# Patient Record
Sex: Male | Born: 1985 | Race: White | Hispanic: No | Marital: Single | Smoking: Current every day smoker
Health system: Southern US, Community
[De-identification: ages and names within clinical notes are randomized; demographics above are authoritative.]

---

## 2012-05-21 ENCOUNTER — Encounter (HOSPITAL_BASED_OUTPATIENT_CLINIC_OR_DEPARTMENT_OTHER): Payer: Self-pay | Admitting: *Deleted

## 2012-05-21 ENCOUNTER — Emergency Department (HOSPITAL_BASED_OUTPATIENT_CLINIC_OR_DEPARTMENT_OTHER)
Admission: EM | Admit: 2012-05-21 | Discharge: 2012-05-22 | Disposition: A | Discharge status: Home / Self Care | Payer: Self-pay | Attending: Emergency Medicine | Admitting: Emergency Medicine

## 2012-05-21 ENCOUNTER — Emergency Department (HOSPITAL_BASED_OUTPATIENT_CLINIC_OR_DEPARTMENT_OTHER): Payer: Self-pay

## 2012-05-21 DIAGNOSIS — Y990 Civilian activity done for income or pay: Secondary | ICD-10-CM | POA: Insufficient documentation

## 2012-05-21 DIAGNOSIS — S9030XA Contusion of unspecified foot, initial encounter: Secondary | ICD-10-CM | POA: Insufficient documentation

## 2012-05-21 DIAGNOSIS — F17200 Nicotine dependence, unspecified, uncomplicated: Secondary | ICD-10-CM | POA: Insufficient documentation

## 2012-05-21 DIAGNOSIS — Y9289 Other specified places as the place of occurrence of the external cause: Secondary | ICD-10-CM | POA: Insufficient documentation

## 2012-05-21 DIAGNOSIS — Y9389 Activity, other specified: Secondary | ICD-10-CM | POA: Insufficient documentation

## 2012-05-21 DIAGNOSIS — W19XXXA Unspecified fall, initial encounter: Secondary | ICD-10-CM | POA: Insufficient documentation

## 2012-05-21 MED ORDER — TRAMADOL HCL 50 MG PO TABS
50.0000 mg | ORAL_TABLET | Freq: Once | ORAL | Status: AC
  Administered 2012-05-21: 50 mg via ORAL
  Filled 2012-05-21: qty 1

## 2012-05-21 NOTE — ED Notes (Signed)
Pt returned from xr

## 2012-05-21 NOTE — ED Provider Notes (Signed)
History   This chart was scribed for Brian Wolfman Smitty Cords, MD by Brian Drake, ED Scribe. The patient was seen in room MH06/MH06 and the patient's care was started at 11:29PM.       CSN: [id number]  Arrival date & time 05/21/12  2136   None     Chief Complaint  Patient presents with  . Foot Injury    (Consider location/radiation/quality/duration/timing/severity/associated sxs/prior treatment) Patient is a 26 y.o. male presenting with foot injury. The history is provided by the patient. No language interpreter was used.  Foot Injury  The incident occurred more than 1 week ago. The incident occurred at work. The injury mechanism was a fall. The pain is present in the right heel. The quality of the pain is described as aching. The pain is severe. The pain has been constant since onset. Pertinent negatives include no numbness, no inability to bear weight, no loss of motion, no muscle weakness, no loss of sensation and no tingling. He reports no foreign bodies present. The symptoms are aggravated by activity. He has tried nothing for the symptoms. The treatment provided no relief.    Brian Drake is a 26 y.o. male who presents to the Emergency Department complaining of gradual, progressively worsening, foot pain located at the right heel, onset one week ago. The pt reports he injured his right heel at work last week, however, he did not feel as if it was serious enough to seek medical evaluation. The pt walked about 15 miles this afternoon, where he believes he may have aggravated his injury. The pt has not taken any medications to relieve his foot pain at the present point in time. Modifying factors include ambulation and application of pressure upon the right heel which intensifies the foot pain.   The pt denies swelling, bruising, discoloration, and loss of sensation at the right foot.   The pt is a current everyday smoker (0.5 packs/day), however, he does not drink alcohol.     History  reviewed. No pertinent past medical history.  History reviewed. No pertinent past surgical history.  History reviewed. No pertinent family history.  History  Substance Use Topics  . Smoking status: Current Every Day Smoker -- 0.5 packs/day    Types: Cigarettes  . Smokeless tobacco: Not on file  . Alcohol Use: No      Review of Systems  Neurological: Negative for tingling and numbness.  All other systems reviewed and are negative.    Allergies  Review of patient's allergies indicates no known allergies.  Home Medications  No current outpatient prescriptions on file.  BP 136/69  Pulse 71  Temp 98 F (36.7 C) (Oral)  Resp 16  Ht 5\' 11"  (1.803 m)  Wt 157 lb (71.215 kg)  BMI 21.90 kg/m2  SpO2 100%  Physical Exam  Nursing note and vitals reviewed. Constitutional: He is oriented to person, place, and time. He appears well-developed and well-nourished. No distress.  HENT:  Head: Normocephalic and atraumatic.  Nose: Nose normal.  Mouth/Throat: Oropharynx is clear and moist.  Eyes: Conjunctivae normal and EOM are normal. Pupils are equal, round, and reactive to light.  Neck: Normal range of motion. Neck supple.  Cardiovascular: Normal rate, regular rhythm and normal heart sounds.   No murmur heard. Pulmonary/Chest: Effort normal and breath sounds normal. He has no wheezes.  Abdominal: Soft. Bowel sounds are normal. There is no tenderness.  Musculoskeletal: Normal range of motion.  Neurological: He is alert and oriented to person, place, and  time. He has normal reflexes.       Bilateral achilles tendon intact. No deformity noted. Lateral pain on the right plantar heel. Sensation intact. Full motor and sensory intact to the right foot cap refill < 2 sec  Skin: Skin is warm and dry.       Capillary refill less than 2 seconds.   Psychiatric: He has a normal mood and affect. His behavior is normal.    ED Course  Procedures (including critical care time)  DIAGNOSTIC  STUDIES: Oxygen Saturation is 100% on room air, normal by my interpretation.    COORDINATION OF CARE:   11:40 PM- Treatment plan concerning pain management discussed with patient. Pt agrees with treatment.     Labs Reviewed - No data to display  No results found for this or any previous visit. Dg Foot Complete Right  05/22/2012  *RADIOLOGY REPORT*  Clinical Data: Pain at the right heel after jumping off truck.  RIGHT FOOT COMPLETE - 3+ VIEW  Comparison: None.  Findings: There is no evidence of fracture or dislocation.  The joint spaces are preserved.  There is no evidence of talar subluxation; the subtalar joint is unremarkable in appearance.  The calcaneus appears intact.  No significant soft tissue abnormalities are seen.  IMPRESSION: No evidence of fracture or dislocation.   Original Report Authenticated By: Tonia Ghent, M.D.       No diagnosis found.    MDM  Trial pain medication. Follow up with orthopedics for ongoing pain.  Pt verbalizes understanding and agrees to treatment    I personally performed the services described in this documentation, which was scribed in my presence. The recorded information has been reviewed and is accurate.      [name], MD 05/22/12 0430

## 2012-05-21 NOTE — ED Notes (Signed)
Pt c/o right heel injury at work x 1 week ago

## 2012-05-21 NOTE — ED Notes (Signed)
Patient transported to X-ray 

## 2012-05-22 MED ORDER — TRAMADOL HCL 50 MG PO TABS: 50.0000 mg | ORAL_TABLET | Freq: Four times a day (QID) | ORAL | Status: AC | PRN

## 2015-03-03 ENCOUNTER — Other Ambulatory Visit: Payer: Self-pay | Admitting: Surgery

## 2017-01-03 ENCOUNTER — Encounter (HOSPITAL_COMMUNITY): Payer: Self-pay | Admitting: Anesthesiology

## 2017-01-03 ENCOUNTER — Encounter (HOSPITAL_COMMUNITY)
Admission: EM | Disposition: A | Discharge status: Home / Self Care | Payer: Self-pay | Source: Home / Self Care | Attending: Emergency Medicine

## 2017-01-03 ENCOUNTER — Emergency Department (HOSPITAL_COMMUNITY): Payer: Worker's Compensation | Admitting: Anesthesiology

## 2017-01-03 ENCOUNTER — Emergency Department (HOSPITAL_COMMUNITY): Payer: Worker's Compensation

## 2017-01-03 ENCOUNTER — Observation Stay (HOSPITAL_COMMUNITY)
Admission: EM | Admit: 2017-01-03 | Discharge: 2017-01-04 | Disposition: A | Discharge status: Home / Self Care | Payer: Worker's Compensation | Attending: Orthopedic Surgery | Admitting: Orthopedic Surgery

## 2017-01-03 DIAGNOSIS — Z23 Encounter for immunization: Secondary | ICD-10-CM | POA: Insufficient documentation

## 2017-01-03 DIAGNOSIS — F17210 Nicotine dependence, cigarettes, uncomplicated: Secondary | ICD-10-CM | POA: Insufficient documentation

## 2017-01-03 DIAGNOSIS — S52572A Other intraarticular fracture of lower end of left radius, initial encounter for closed fracture: Secondary | ICD-10-CM | POA: Diagnosis present

## 2017-01-03 DIAGNOSIS — S0121XA Laceration without foreign body of nose, initial encounter: Secondary | ICD-10-CM | POA: Insufficient documentation

## 2017-01-03 DIAGNOSIS — Y990 Civilian activity done for income or pay: Secondary | ICD-10-CM | POA: Diagnosis not present

## 2017-01-03 DIAGNOSIS — W11XXXA Fall on and from ladder, initial encounter: Secondary | ICD-10-CM | POA: Insufficient documentation

## 2017-01-03 DIAGNOSIS — S52502A Unspecified fracture of the lower end of left radius, initial encounter for closed fracture: Secondary | ICD-10-CM | POA: Diagnosis present

## 2017-01-03 DIAGNOSIS — W19XXXA Unspecified fall, initial encounter: Secondary | ICD-10-CM

## 2017-01-03 DIAGNOSIS — G5602 Carpal tunnel syndrome, left upper limb: Secondary | ICD-10-CM | POA: Diagnosis not present

## 2017-01-03 DIAGNOSIS — Y9389 Activity, other specified: Secondary | ICD-10-CM | POA: Insufficient documentation

## 2017-01-03 HISTORY — PX: OPEN REDUCTION INTERNAL FIXATION (ORIF) DISTAL RADIAL FRACTURE: SHX5989

## 2017-01-03 HISTORY — PX: CARPAL TUNNEL RELEASE: SHX101

## 2017-01-03 LAB — BASIC METABOLIC PANEL
Anion gap: 8 (ref 5–15)
BUN: 17 mg/dL (ref 6–20)
CHLORIDE: 106 mmol/L (ref 101–111)
CO2: 27 mmol/L (ref 22–32)
CREATININE: 0.69 mg/dL (ref 0.61–1.24)
Calcium: 9.2 mg/dL (ref 8.9–10.3)
GFR calc Af Amer: >60 mL/min (ref >60)
GFR calc non Af Amer: >60 mL/min (ref >60)
GLUCOSE: 93 mg/dL (ref 65–99)
Potassium: 3.7 mmol/L (ref 3.5–5.1)
Sodium: 141 mmol/L (ref 135–145)

## 2017-01-03 LAB — CBC WITH DIFFERENTIAL/PLATELET
Basophils Absolute: 0 10*3/uL (ref 0.0–0.1)
Basophils Relative: 0 %
Eosinophils Absolute: 0.1 10*3/uL (ref 0.0–0.7)
Eosinophils Relative: 1 %
HEMATOCRIT: 40.7 % (ref 39.0–52.0)
HEMOGLOBIN: 14.4 g/dL (ref 13.0–17.0)
LYMPHS ABS: 1.7 10*3/uL (ref 0.7–4.0)
Lymphocytes Relative: 15 %
MCH: 29.7 pg (ref 26.0–34.0)
MCHC: 35.4 g/dL (ref 30.0–36.0)
MCV: 83.9 fL (ref 78.0–100.0)
MONO ABS: 0.6 10*3/uL (ref 0.1–1.0)
Monocytes Relative: 5 %
NEUTROS ABS: 8.8 10*3/uL — AB (ref 1.7–7.7)
NEUTROS PCT: 79 %
Platelets: 157 10*3/uL (ref 150–400)
RBC: 4.85 MIL/uL (ref 4.22–5.81)
RDW: 12.7 % (ref 11.5–15.5)
WBC: 11.2 10*3/uL — ABNORMAL HIGH (ref 4.0–10.5)

## 2017-01-03 SURGERY — OPEN REDUCTION INTERNAL FIXATION (ORIF) DISTAL RADIUS FRACTURE
Anesthesia: General | Site: Wrist | Laterality: Left

## 2017-01-03 MED ORDER — ROCURONIUM BROMIDE 50 MG/5ML IV SOSY
PREFILLED_SYRINGE | INTRAVENOUS | Status: AC
  Filled 2017-01-03: qty 5

## 2017-01-03 MED ORDER — ONDANSETRON HCL 4 MG/2ML IJ SOLN
INTRAMUSCULAR | Status: AC
  Filled 2017-01-03: qty 2

## 2017-01-03 MED ORDER — SODIUM CHLORIDE 0.9 % IV SOLN
Freq: Once | INTRAVENOUS | Status: AC
Start: 2017-01-03 — End: 2017-01-03
  Administered 2017-01-03: 100 mL/h via INTRAVENOUS

## 2017-01-03 MED ORDER — TETANUS-DIPHTH-ACELL PERTUSSIS 5-2.5-18.5 LF-MCG/0.5 IM SUSP
0.5000 mL | Freq: Once | INTRAMUSCULAR | Status: AC
  Administered 2017-01-03: 0.5 mL via INTRAMUSCULAR
  Filled 2017-01-03: qty 0.5

## 2017-01-03 MED ORDER — FENTANYL CITRATE (PF) 100 MCG/2ML IJ SOLN
INTRAMUSCULAR | Status: DC | PRN
  Administered 2017-01-03: 50 ug via INTRAVENOUS
  Administered 2017-01-03 (×2): 25 ug via INTRAVENOUS

## 2017-01-03 MED ORDER — ONDANSETRON HCL 4 MG PO TABS: 4.0000 mg | ORAL_TABLET | Freq: Four times a day (QID) | ORAL | Status: DC | PRN

## 2017-01-03 MED ORDER — SENNA 8.6 MG PO TABS
1.0000 | ORAL_TABLET | Freq: Two times a day (BID) | ORAL | Status: DC
  Administered 2017-01-04: 8.6 mg via ORAL
  Filled 2017-01-03: qty 1

## 2017-01-03 MED ORDER — SUCCINYLCHOLINE CHLORIDE 200 MG/10ML IV SOSY
PREFILLED_SYRINGE | INTRAVENOUS | Status: AC
  Filled 2017-01-03: qty 10

## 2017-01-03 MED ORDER — METHOCARBAMOL 1000 MG/10ML IJ SOLN
500.0000 mg | Freq: Four times a day (QID) | INTRAVENOUS | Status: DC | PRN
  Filled 2017-01-03: qty 5

## 2017-01-03 MED ORDER — ONDANSETRON HCL 4 MG/2ML IJ SOLN: 4.0000 mg | Freq: Four times a day (QID) | INTRAMUSCULAR | Status: DC | PRN

## 2017-01-03 MED ORDER — ALPRAZOLAM 0.5 MG PO TABS: 0.5000 mg | ORAL_TABLET | Freq: Four times a day (QID) | ORAL | Status: DC | PRN

## 2017-01-03 MED ORDER — CEFAZOLIN SODIUM-DEXTROSE 1-4 GM/50ML-% IV SOLN
1.0000 g | INTRAVENOUS | Status: AC
  Administered 2017-01-03: 1 g via INTRAVENOUS
  Filled 2017-01-03: qty 50

## 2017-01-03 MED ORDER — MIDAZOLAM HCL 2 MG/2ML IJ SOLN
2.0000 mg | Freq: Once | INTRAMUSCULAR | Status: AC
  Administered 2017-01-03: 2 mg via INTRAVENOUS

## 2017-01-03 MED ORDER — LIDOCAINE-EPINEPHRINE 2 %-1:100000 IJ SOLN
INTRAMUSCULAR | Status: DC | PRN
  Administered 2017-01-03: 10 mL via PERINEURAL

## 2017-01-03 MED ORDER — ONDANSETRON HCL 4 MG/2ML IJ SOLN
INTRAMUSCULAR | Status: DC | PRN
  Administered 2017-01-03: 4 mg via INTRAVENOUS

## 2017-01-03 MED ORDER — PROPOFOL 10 MG/ML IV BOLUS
INTRAVENOUS | Status: AC
  Filled 2017-01-03: qty 20

## 2017-01-03 MED ORDER — LIDOCAINE 2% (20 MG/ML) 5 ML SYRINGE
INTRAMUSCULAR | Status: DC | PRN
  Administered 2017-01-03: 50 mg via INTRAVENOUS

## 2017-01-03 MED ORDER — DEXAMETHASONE SODIUM PHOSPHATE 10 MG/ML IJ SOLN
INTRAMUSCULAR | Status: AC
  Filled 2017-01-03: qty 1

## 2017-01-03 MED ORDER — SUCCINYLCHOLINE CHLORIDE 200 MG/10ML IV SOSY
PREFILLED_SYRINGE | INTRAVENOUS | Status: DC | PRN
  Administered 2017-01-03: 120 mg via INTRAVENOUS

## 2017-01-03 MED ORDER — SODIUM CHLORIDE 0.45 % IV SOLN
INTRAVENOUS | Status: DC
  Administered 2017-01-04: 02:00:00 via INTRAVENOUS

## 2017-01-03 MED ORDER — DEXAMETHASONE SODIUM PHOSPHATE 10 MG/ML IJ SOLN
INTRAMUSCULAR | Status: DC | PRN
  Administered 2017-01-03: 10 mg via INTRAVENOUS

## 2017-01-03 MED ORDER — PANTOPRAZOLE SODIUM 40 MG PO TBEC: 40.0000 mg | DELAYED_RELEASE_TABLET | Freq: Two times a day (BID) | ORAL | Status: DC | PRN

## 2017-01-03 MED ORDER — SODIUM CHLORIDE 0.9 % IR SOLN
Status: DC | PRN
  Administered 2017-01-03: 1000 mL

## 2017-01-03 MED ORDER — ADULT MULTIVITAMIN W/MINERALS CH
1.0000 | ORAL_TABLET | Freq: Every day | ORAL | Status: DC
  Administered 2017-01-04: 1 via ORAL
  Filled 2017-01-03: qty 1

## 2017-01-03 MED ORDER — LIDOCAINE 2% (20 MG/ML) 5 ML SYRINGE
INTRAMUSCULAR | Status: AC
  Filled 2017-01-03: qty 5

## 2017-01-03 MED ORDER — ROCURONIUM BROMIDE 10 MG/ML (PF) SYRINGE
PREFILLED_SYRINGE | INTRAVENOUS | Status: DC | PRN
  Administered 2017-01-03: 35 mg via INTRAVENOUS
  Administered 2017-01-03: 20 mg via INTRAVENOUS
  Administered 2017-01-03: 5 mg via INTRAVENOUS

## 2017-01-03 MED ORDER — CEFAZOLIN SODIUM-DEXTROSE 1-4 GM/50ML-% IV SOLN
1.0000 g | Freq: Three times a day (TID) | INTRAVENOUS | Status: DC
  Administered 2017-01-04: 1 g via INTRAVENOUS
  Filled 2017-01-03 (×2): qty 50

## 2017-01-03 MED ORDER — VITAMIN C 500 MG PO TABS
1000.0000 mg | ORAL_TABLET | Freq: Every day | ORAL | Status: DC
  Administered 2017-01-04: 1000 mg via ORAL
  Filled 2017-01-03: qty 2

## 2017-01-03 MED ORDER — ROPIVACAINE HCL 5 MG/ML IJ SOLN
INTRAMUSCULAR | Status: DC | PRN
  Administered 2017-01-03: 10 mL via PERINEURAL

## 2017-01-03 MED ORDER — METHOCARBAMOL 500 MG PO TABS: 500.0000 mg | ORAL_TABLET | Freq: Four times a day (QID) | ORAL | Status: DC | PRN

## 2017-01-03 MED ORDER — MIDAZOLAM HCL 5 MG/5ML IJ SOLN
INTRAMUSCULAR | Status: DC | PRN
  Administered 2017-01-03: 2 mg via INTRAVENOUS

## 2017-01-03 MED ORDER — CEFAZOLIN SODIUM-DEXTROSE 2-4 GM/100ML-% IV SOLN
INTRAVENOUS | Status: AC
Start: 2017-01-03 — End: 2017-01-04
  Filled 2017-01-03: qty 100

## 2017-01-03 MED ORDER — FENTANYL CITRATE (PF) 100 MCG/2ML IJ SOLN: 25.0000 ug | INTRAMUSCULAR | Status: DC | PRN

## 2017-01-03 MED ORDER — MORPHINE SULFATE (PF) 4 MG/ML IV SOLN: 1.0000 mg | INTRAVENOUS | Status: DC | PRN

## 2017-01-03 MED ORDER — OXYCODONE-ACETAMINOPHEN 5-325 MG PO TABS
1.0000 | ORAL_TABLET | ORAL | Status: DC | PRN
  Administered 2017-01-03: 1 via ORAL
  Filled 2017-01-03: qty 1

## 2017-01-03 MED ORDER — LACTATED RINGERS IV SOLN: INTRAVENOUS | Status: DC

## 2017-01-03 MED ORDER — PROMETHAZINE HCL 25 MG/ML IJ SOLN: 6.2500 mg | INTRAMUSCULAR | Status: DC | PRN

## 2017-01-03 MED ORDER — LACTATED RINGERS IV SOLN
INTRAVENOUS | Status: DC | PRN
  Administered 2017-01-03: 20:00:00 via INTRAVENOUS

## 2017-01-03 MED ORDER — FENTANYL CITRATE (PF) 100 MCG/2ML IJ SOLN
INTRAMUSCULAR | Status: AC
  Filled 2017-01-03: qty 2

## 2017-01-03 MED ORDER — OXYCODONE HCL 5 MG PO TABS
5.0000 mg | ORAL_TABLET | ORAL | Status: DC | PRN
  Administered 2017-01-04: 11:00:00 10 mg via ORAL
  Filled 2017-01-03: qty 2

## 2017-01-03 MED ORDER — PROPOFOL 10 MG/ML IV BOLUS
INTRAVENOUS | Status: DC | PRN
  Administered 2017-01-03: 150 mg via INTRAVENOUS

## 2017-01-03 MED ORDER — SUGAMMADEX SODIUM 200 MG/2ML IV SOLN
INTRAVENOUS | Status: DC | PRN
  Administered 2017-01-03: 150 mg via INTRAVENOUS

## 2017-01-03 MED ORDER — MIDAZOLAM HCL 2 MG/2ML IJ SOLN
INTRAMUSCULAR | Status: AC
  Filled 2017-01-03: qty 2

## 2017-01-03 MED ORDER — PROMETHAZINE HCL 12.5 MG RE SUPP
12.5000 mg | Freq: Four times a day (QID) | RECTAL | Status: DC | PRN
  Filled 2017-01-03: qty 1

## 2017-01-03 MED ORDER — CEFAZOLIN SODIUM-DEXTROSE 2-3 GM-% IV SOLR
2.0000 g | Freq: Once | INTRAVENOUS | Status: AC
Start: 2017-01-03 — End: 2017-01-03
  Administered 2017-01-03: 2 g via INTRAVENOUS
  Filled 2017-01-03: qty 50

## 2017-01-03 SURGICAL SUPPLY — 75 items
BAG ZIPLOCK 12X15 (MISCELLANEOUS) IMPLANT
BANDAGE ACE 3X5.8 VEL STRL LF (GAUZE/BANDAGES/DRESSINGS) IMPLANT
BANDAGE ACE 4X5 VEL STRL LF (GAUZE/BANDAGES/DRESSINGS) ×6 IMPLANT
BIT DRILL 2.2 SS TIBIAL (BIT) ×6 IMPLANT
BNDG GAUZE ELAST 4 BULKY (GAUZE/BANDAGES/DRESSINGS) ×6 IMPLANT
CORD BIPOLAR FORCEPS 12FT (ELECTRODE) ×3 IMPLANT
COVER SURGICAL LIGHT HANDLE (MISCELLANEOUS) ×3 IMPLANT
CUFF TOURN SGL QUICK 18 (TOURNIQUET CUFF) ×3 IMPLANT
DECANTER SPIKE VIAL GLASS SM (MISCELLANEOUS) IMPLANT
DRAPE OEC MINIVIEW 54X84 (DRAPES) ×3 IMPLANT
DRAPE SURG 17X11 SM STRL (DRAPES) ×3 IMPLANT
DRAPE U-SHAPE 47X51 STRL (DRAPES) ×3 IMPLANT
DRSG EMULSION OIL 3X16 NADH (GAUZE/BANDAGES/DRESSINGS) ×3 IMPLANT
DRSG EMULSION OIL 3X3 NADH (GAUZE/BANDAGES/DRESSINGS) ×3 IMPLANT
DRSG PAD ABDOMINAL 8X10 ST (GAUZE/BANDAGES/DRESSINGS) IMPLANT
ELECT REM PT RETURN 15FT ADLT (MISCELLANEOUS) ×3 IMPLANT
EVACUATOR 1/8 PVC DRAIN (DRAIN) IMPLANT
GAUZE SPONGE 4X4 12PLY STRL (GAUZE/BANDAGES/DRESSINGS) ×3 IMPLANT
GAUZE SPONGE 4X4 16PLY XRAY LF (GAUZE/BANDAGES/DRESSINGS) ×6 IMPLANT
GAUZE XEROFORM 5X9 LF (GAUZE/BANDAGES/DRESSINGS) ×3 IMPLANT
GLOVE BIO SURGEON STRL SZ8 (GLOVE) IMPLANT
GOWN STRL REUS W/TWL XL LVL3 (GOWN DISPOSABLE) ×9 IMPLANT
KIT BASIN OR (CUSTOM PROCEDURE TRAY) ×3 IMPLANT
KWIRE 4.0 X .045IN (WIRE) IMPLANT
KWIRE 4.0 X .062IN (WIRE) IMPLANT
MANIFOLD NEPTUNE II (INSTRUMENTS) ×3 IMPLANT
NEEDLE HYPO 25X1 1.5 SAFETY (NEEDLE) IMPLANT
NS IRRIG 1000ML POUR BTL (IV SOLUTION) ×6 IMPLANT
PACK ORTHO EXTREMITY (CUSTOM PROCEDURE TRAY) ×3 IMPLANT
PAD CAST 3X4 CTTN HI CHSV (CAST SUPPLIES) IMPLANT
PAD CAST 4YDX4 CTTN HI CHSV (CAST SUPPLIES) ×2 IMPLANT
PADDING CAST COTTON 3X4 STRL (CAST SUPPLIES)
PADDING CAST COTTON 4X4 STRL (CAST SUPPLIES) ×4
PEG LOCKING SMOOTH 2.2X18 (Peg) ×9 IMPLANT
PEG LOCKING SMOOTH 2.2X20 (Screw) ×6 IMPLANT
PLATE STD DVR LEFT (Plate) ×3 IMPLANT
PLATE STD DVR LT 24X55 (Plate) ×1 IMPLANT
POSITIONER SURGICAL ARM (MISCELLANEOUS) IMPLANT
SCREW LOCK 10X2.7X3 LD THRD (Screw) ×1 IMPLANT
SCREW LOCK 14X2.7X 3 LD TPR (Screw) ×1 IMPLANT
SCREW LOCK 16X2.7X 3 LD TPR (Screw) ×3 IMPLANT
SCREW LOCK 20X2.7X 3 LD TPR (Screw) ×1 IMPLANT
SCREW LOCKING 2.7X10MM (Screw) ×2 IMPLANT
SCREW LOCKING 2.7X14 (Screw) ×2 IMPLANT
SCREW LOCKING 2.7X16 (Screw) ×6 IMPLANT
SCREW LOCKING 2.7X20MM (Screw) ×2 IMPLANT
SCREW MULTI DIRECTIONAL 2.7X20 (Screw) ×3 IMPLANT
SCREW MULTI DIRECTIONAL 2.7X22 (Screw) ×3 IMPLANT
SOL PREP POV-IOD 4OZ 10% (MISCELLANEOUS) ×3 IMPLANT
SOL PREP PROV IODINE SCRUB 4OZ (MISCELLANEOUS) ×3 IMPLANT
SPLINT FIBERGLASS 4X30 (CAST SUPPLIES) ×3 IMPLANT
SUCTION FRAZIER HANDLE 12FR (TUBING) ×2
SUCTION TUBE FRAZIER 12FR DISP (TUBING) ×1 IMPLANT
SUT BONE WAX W31G (SUTURE) IMPLANT
SUT ETHILON 6 0 PS 3 18 (SUTURE) IMPLANT
SUT MERSILENE 4 0 P 3 (SUTURE) IMPLANT
SUT MNCRL AB 4-0 PS2 18 (SUTURE) IMPLANT
SUT PROLENE 3 0 PS 2 (SUTURE) IMPLANT
SUT PROLENE 4 0 P 3 18 (SUTURE) IMPLANT
SUT PROLENE 4 0 PS 2 18 (SUTURE) ×9 IMPLANT
SUT PROLENE 4 0 RB 1 (SUTURE)
SUT PROLENE 4-0 RB1 .5 CRCL 36 (SUTURE) IMPLANT
SUT VIC AB 0 CT1 27 (SUTURE)
SUT VIC AB 0 CT1 27XBRD ANTBC (SUTURE) IMPLANT
SUT VIC AB 2-0 CT1 27 (SUTURE)
SUT VIC AB 2-0 CT1 TAPERPNT 27 (SUTURE) IMPLANT
SUT VIC AB 3-0 PS2 18 (SUTURE) ×4
SUT VIC AB 3-0 PS2 18XBRD (SUTURE) ×2 IMPLANT
SYR CONTROL 10ML LL (SYRINGE) IMPLANT
SYSTEM CHEST DRAIN TLS 7FR (DRAIN) ×3 IMPLANT
TOWEL OR 17X26 10 PK STRL BLUE (TOWEL DISPOSABLE) ×6 IMPLANT
TUBING CONNECTING 10 (TUBING) ×2 IMPLANT
TUBING CONNECTING 10' (TUBING) ×1
UNDERPAD 30X30 (UNDERPADS AND DIAPERS) ×3 IMPLANT
WATER STERILE IRR 1500ML POUR (IV SOLUTION) IMPLANT

## 2017-01-03 NOTE — Anesthesia Preprocedure Evaluation (Signed)
Anesthesia Evaluation  Patient identified by MRN, date of birth, ID band Patient awake    Reviewed: Allergy & Precautions, NPO status , Patient's Chart, lab work & pertinent test results  Airway Mallampati: II  TM Distance: >3 FB Neck ROM: Full    Dental  (+) Teeth Intact, Dental Advisory Given   Pulmonary Current Smoker,    Pulmonary exam normal breath sounds clear to auscultation       Cardiovascular negative cardio ROS Normal cardiovascular exam Rhythm:Regular Rate:Normal     Neuro/Psych negative neurological ROS     GI/Hepatic negative GI ROS, Neg liver ROS,   Endo/Other  negative endocrine ROS  Renal/GU negative Renal ROS     Musculoskeletal Left distal radius fracture   Abdominal   Peds  Hematology negative hematology ROS (+)   Anesthesia Other Findings Day of surgery medications reviewed with the patient.  Reproductive/Obstetrics                             Anesthesia Physical Anesthesia Plan  ASA: II and emergent  Anesthesia Plan: General   Post-op Pain Management:  Regional for Post-op pain   Induction: Intravenous  PONV Risk Score and Plan: 1 and Ondansetron and Dexamethasone  Airway Management Planned: Oral ETT  Additional Equipment:   Intra-op Plan:   Post-operative Plan: Extubation in OR  Informed Consent: I have reviewed the patients History and Physical, chart, labs and discussed the procedure including the risks, benefits and alternatives for the proposed anesthesia with the patient or authorized representative who has indicated his/her understanding and acceptance.   Dental advisory given  Plan Discussed with: CRNA  Anesthesia Plan Comments: (Risks/benefits of general anesthesia discussed with patient including risk of damage to teeth, lips, gum, and tongue, nausea/vomiting, allergic reactions to medications, and the possibility of heart attack, stroke and  death.  All patient questions answered.  Patient wishes to proceed.)        Anesthesia Quick Evaluation

## 2017-01-03 NOTE — ED Provider Notes (Signed)
[facility]-EMERGENCY DEPT Provider Note   CSN: [id number] Arrival date & time: 01/03/17  1191  By signing my name below, I, Brian Drake, attest that this documentation has been prepared under the direction and in the presence of Lyndel Safe PA-C.  Electronically Signed: Vista Drake, ED Scribe. 01/03/17. 12:33 PM.  History   Chief Complaint Chief Complaint  Patient presents with  . Wrist Injury    HPI HPI Comments: Brian Drake is a 31 y.o. male who presents to the Emergency Department s/p an injury that occurred just prior to arrival. Pt was at the top of a 6 foot ladder when the brace of the ladder gave out. Pt fell off of the ladder landing first with his left wrist outstretched. He also struck his face and nose secondary to the wrist. He has a small laceration and superficial abrasion to his nose. He also notes mild pain to the lateral left leg which he struck during the fall as well, no open wound or active bleeding. Currently, pt has noted swelling and deformity to left wrist. He reports an exacerbation of pain when attempting to move the wrist. Pt denies any numbness or tingling in the hands or fingers. He is able to move the fingers but with decreased ROM due to pain. He was able to ambulate immediately after the incident. No LOC. No pain to left elbow or shoulder. He denies any neck or back pain. He does not have a current PCP. His Tetanus is not UTD.  The history is provided by the patient. No language interpreter was used.    History reviewed. No pertinent past medical history.  Patient Active Problem List   Diagnosis Date Noted  . Closed fracture of left distal radius 01/03/2017    History reviewed. No pertinent surgical history.     Home Medications    Prior to Admission medications   Medication Sig Start Date End Date Taking? Authorizing Provider  naproxen sodium (ANAPROX) 220 MG tablet Take 220 mg by mouth 2 (two) times daily as needed (pain).   Yes [provider]  traMADol (ULTRAM) 50 MG tablet Take 1 tablet (50 mg total) by mouth every 6 (six) hours as needed for pain. Patient not taking: Reported on 01/03/2017 05/22/12   Cy Blamer, MD    Family History History reviewed. No pertinent family history.  Social History Social History  Substance Use Topics  . Smoking status: Current Every Day Smoker    Packs/day: 0.50    Types: Cigarettes  . Smokeless tobacco: Not on file  . Alcohol use No     Allergies   Patient has no known allergies.   Review of Systems Review of Systems  Musculoskeletal: Positive for arthralgias and joint swelling. Negative for back pain, gait problem, myalgias, neck pain and neck stiffness.  Skin: Positive for wound.  Neurological: Negative for weakness and numbness.  All other systems reviewed and are negative.    Physical Exam Updated Vital Signs BP 129/75 (BP Location: Right Arm)   Pulse (!) 59   Temp 98.3 F (36.8 C)   Resp 17   Ht 5\' 11"  (1.803 m)   Wt 70.3 kg (155 lb)   SpO2 97%   BMI 21.62 kg/m   Physical Exam  Constitutional: He is oriented to person, place, and time. He appears well-developed and well-nourished. No distress.  HENT:  Head: Normocephalic and atraumatic.  No septal hematomas or deviation. Small amount of blood around the nares but not in the  nasal canal. Mild hematoma to the left upper lip without obvious cuts, mild swelling.   Eyes: Pupils are equal, round, and reactive to light. EOM are normal. No scleral icterus.  Neck: Normal range of motion.  Pulmonary/Chest: Effort normal.  Musculoskeletal:  No midline or paraspinal tenderness, C, T or L spine.  Obvious deformity to left wrist. Hand is neurovascularly intact. Patient is able to perform active range of motion of all left fingers.    Neurological: He is alert and oriented to person, place, and time.  Patient is grossly neurologic intact. Gait is normal and steady.  Skin: Skin is warm and dry. He is not  diaphoretic.  1cm laceration on the right side of the nose, abrasions over the tip.   Psychiatric: He has a normal mood and affect. Judgment normal.  Nursing note and vitals reviewed.       ED Treatments / Results  DIAGNOSTIC STUDIES: Oxygen Saturation is 100% on RA, normal by my interpretation.  COORDINATION OF CARE: 12:33 PM-Discussed treatment plan with pt at bedside and pt agreed to plan.   Labs (all labs ordered are listed, but only abnormal results are displayed) Labs Reviewed  CBC WITH DIFFERENTIAL/PLATELET - Abnormal; Notable for the following:       Result Value   WBC 11.2 (*)    Neutro Abs 8.8 (*)    All other components within normal limits  BASIC METABOLIC PANEL    EKG  EKG Interpretation None       Radiology Dg Chest 2 View  Result Date: 01/03/2017 CLINICAL DATA:  Preop.  Wrist fracture.  Current everyday smoker. EXAM: CHEST  2 VIEW COMPARISON:  None. FINDINGS: Generous lung volumes. There is no edema, consolidation, effusion, or pneumothorax. Normal heart size and mediastinal contours. IMPRESSION: 1. No evidence of active disease. 2. Large lung volumes. Electronically Signed   By: Marnee SpringJonathon  Watts M.D.   On: 01/03/2017 14:30   Dg Wrist Complete Left  Result Date: 01/03/2017 CLINICAL DATA:  Fall from a ladder this morning.  Initial encounter. EXAM: LEFT WRIST - COMPLETE 3+ VIEW COMPARISON:  None. FINDINGS: Comminuted fracture of the distal radius with intra-articular involvement. Dorsal impaction with dorsal wrist tilting. Probable loss of radial inclination. Neutral ulnar variance. Located radiocarpal joint. No carpus fracture noted. IMPRESSION: Comminuted intra-articular distal radius fracture with dorsal impaction and wrist tilting. Electronically Signed   By: Marnee SpringJonathon  Watts M.D.   On: 01/03/2017 10:36   Dg Hand Complete Left  Result Date: 01/03/2017 CLINICAL DATA:  Fall from ladder with left wrist pain. Initial encounter. EXAM: LEFT HAND - COMPLETE 3+ VIEW  COMPARISON:  None. FINDINGS: Comminuted fracture of the distal radius with and dorsal impaction causing dorsal wrist tilting. Up to 2 mm of articular surface offset at the fracture. No visible carpus fracture or malalignment. IMPRESSION: Comminuted intra-articular distal radius fracture with dorsal impaction. Electronically Signed   By: Marnee SpringJonathon  Watts M.D.   On: 01/03/2017 10:37    Procedures Procedures (including critical care time)  Medications Ordered in ED Medications  Tdap (BOOSTRIX) injection 0.5 mL (0.5 mLs Intramuscular Given 01/03/17 1336)  0.9 %  sodium chloride infusion (100 mL/hr Intravenous New Bag/Given 01/03/17 1442)  Oxycodone acetaminophen 5 mg.    Initial Impression / Assessment and Plan / ED Course  I have reviewed the triage vital signs and the nursing notes.  Pertinent labs & imaging results that were available during my care of the patient were reviewed by me and considered in my  medical decision making (see chart for details).  Clinical Course as of Jan 04 146  Tue Jan 03, 2017  1245 Spoke with Sue Lush from hand in person.  He will see patient in the ED.   [EH]  1338 Spoke with hand PA.  Per his request will start an IV for pain control as needed, obtain CBC, BMP, CXR for pre-surgical clearance and make patient NPO.   [EH]    Clinical Course User Index [EH] Cristina Gong, PA-C   Leighton Roach presents for evaluation of left wrist pain and deformity after falling approximately 6 feet off a ladder. He has minor scrapes and abrasions to his nose, no obvious other significant injuries. He did not strike his head on he fell and is on no blood thinners.  Imaging was obtained of left wrist which showed fracture consistent with deformity seen on physical exam. Hand surgery was consulted who came and evaluated the patient.  Per their requests CBC, BMP, and chest x-ray were obtained for presurgical screenings and the patient was made nothing by mouth.  While  waiting to go to the operating room patient was held in the ED. He was reevaluated multiple times and continued to decline additional pain medication after his wrist was splinted.  Patient was given the option to ask questions, all of which were answered to the best of my abilities. Patient was taken to the operating room for surgical repair.  The patient appears reasonably stabilized for admission considering the current resources, flow, and capabilities available in the ED at this time, and I doubt any other [facility] requiring further screening and/or treatment in the ED prior to admission.    Final Clinical Impressions(s) / ED Diagnoses   Final diagnoses:  Fall, initial encounter  Other closed intra-articular fracture of distal end of left radius, initial encounter    New Prescriptions Current Discharge Medication List    I personally performed the services described in this documentation, which was scribed in my presence. The recorded information has been reviewed and is accurate.     Cristina Gong, PA-C 01/04/17 Geronimo Boot    Rolland Porter, MD 01/05/17 1046

## 2017-01-03 NOTE — ED Notes (Signed)
Bed: [id number] Expected date:  Expected time:  Means of arrival:  Comments: TR 5

## 2017-01-03 NOTE — ED Notes (Signed)
Patient transported to X-ray 

## 2017-01-03 NOTE — Anesthesia Procedure Notes (Signed)
Anesthesia Regional Block: Supraclavicular block   Pre-Anesthetic Checklist: ,, timeout performed, Correct Patient, Correct Site, Correct Laterality, Correct Procedure, Correct Position, site marked, Risks and benefits discussed,  Surgical consent,  Pre-op evaluation,  At surgeon's request and post-op pain management  Laterality: Left  Prep: chloraprep       Needles:  Injection technique: Single-shot  Needle Type: Echogenic Needle     Needle Length: 9cm  Needle Gauge: 21     Additional Needles:   Procedures: ultrasound guided,,,,,,,,  Narrative:  Start time: 01/03/2017 7:21 PM End time: 01/03/2017 7:26 PM Injection made incrementally with aspirations every 5 mL.  Performed by: Personally  Anesthesiologist: Cecile HearingURK, Myrissa Chipley EDWARD  Additional Notes: No pain on injection. No increased resistance to injection. Injection made in 5cc increments.  Good needle visualization.  Patient tolerated procedure well.

## 2017-01-03 NOTE — ED Triage Notes (Signed)
Pt c/o left wrist pain and deformity after falling from 6 foot high ladder onto concrete and landing first and mostly on wrist, also struck nose and lip with less force. No LOC. No anticoagulants.

## 2017-01-03 NOTE — Transfer of Care (Signed)
Immediate Anesthesia Transfer of Care Note  Patient: Brian Drake  Procedure(s) Performed: Procedure(s): OPEN REDUCTION INTERNAL FIXATION (ORIF) DISTAL RADIAL FRACTURE (Left) CARPAL TUNNEL RELEASE (Left)  Patient Location: PACU  Anesthesia Type:General  Level of Consciousness:  sedated, patient cooperative and responds to stimulation  Airway & Oxygen Therapy:Patient Spontanous Breathing and Patient connected to face mask oxgen  Post-op Assessment:  Report given to PACU RN and Post -op Vital signs reviewed and stable  Post vital signs:  Reviewed and stable  Last Vitals:  Vitals:   01/03/17 1948 01/03/17 2008  BP: 123/68 126/71  Pulse: 72 71  Resp: 14 15  Temp:      Complications: No apparent anesthesia complications

## 2017-01-03 NOTE — Anesthesia Procedure Notes (Signed)
Procedure Name: Intubation Date/Time: 01/03/2017 8:37 PM Performed by: Anne Fu Pre-anesthesia Checklist: Patient identified, Emergency Drugs available, Suction available, Patient being monitored and Timeout performed Patient Re-evaluated:Patient Re-evaluated prior to induction Oxygen Delivery Method: Circle system utilized Preoxygenation: Pre-oxygenation with 100% oxygen Induction Type: IV induction, Rapid sequence and Cricoid Pressure applied Laryngoscope Size: Mac and 4 Grade View: Grade II Tube type: Oral Tube size: 7.5 mm Number of attempts: 1 Airway Equipment and Method: Stylet Placement Confirmation: ETT inserted through vocal cords under direct vision,  positive ETCO2 and breath sounds checked- equal and bilateral Secured at: 24 cm Tube secured with: Tape Dental Injury: Teeth and Oropharynx as per pre-operative assessment

## 2017-01-03 NOTE — Op Note (Signed)
See dictation#[id number] SP ORIF Left Radius Fx and CTR Arrian Manson MD

## 2017-01-03 NOTE — H&P (Signed)
Brian RoachMatthew Drake is an 31 y.o. male.   Chief Complaint: I fell and hurt my wrist HPI: The patient is a pleasant 31 year old male who sustained an on-the-job injury earlier this morning. He was working on a light overhead when the ladder "slipped out from under him". He fell landing on to the left upper extremity. He had noted pain, swelling and deformity present. He presents to the emergency room setting for evaluation. The patient has been seen and evaluated by the emergency room staff and is noted to have a comminuted displaced left distal radius fracture. Hand consultation was implemented for the need for surgical intervention. Patient denies injury to the left elbow or shoulder. He states he sustained abrasions about the nasal bridge does not complain of any difficulty breathing through his nose. He denies significant numbness or tingling about the hand but states he has had an ice pack applied to the hand so it's difficult to determine the degree of his sensation. He denies pain about the lower extremities or right upper extremity. He denies any neck pain. He has been nothing by mouth since 8 AM this morning.  No past medical history on file.  No past surgical history on file.  No family history on file. Social History:  reports that he has been smoking Cigarettes.  He has been smoking about 0.50 packs per day. He does not have any smokeless tobacco history on file. He reports that he does not drink alcohol or use drugs.  Allergies: No Known Allergies   (Not in a hospital admission)  No results found for this or any previous visit (from the past 48 hour(s)). Dg Wrist Complete Left  Result Date: 01/03/2017 CLINICAL DATA:  Fall from a ladder this morning.  Initial encounter. EXAM: LEFT WRIST - COMPLETE 3+ VIEW COMPARISON:  None. FINDINGS: Comminuted fracture of the distal radius with intra-articular involvement. Dorsal impaction with dorsal wrist tilting. Probable loss of radial inclination.  Neutral ulnar variance. Located radiocarpal joint. No carpus fracture noted. IMPRESSION: Comminuted intra-articular distal radius fracture with dorsal impaction and wrist tilting. Electronically Signed   By: Marnee SpringJonathon  Watts M.D.   On: 01/03/2017 10:36   Dg Hand Complete Left  Result Date: 01/03/2017 CLINICAL DATA:  Fall from ladder with left wrist pain. Initial encounter. EXAM: LEFT HAND - COMPLETE 3+ VIEW COMPARISON:  None. FINDINGS: Comminuted fracture of the distal radius with and dorsal impaction causing dorsal wrist tilting. Up to 2 mm of articular surface offset at the fracture. No visible carpus fracture or malalignment. IMPRESSION: Comminuted intra-articular distal radius fracture with dorsal impaction. Electronically Signed   By: Marnee SpringJonathon  Watts M.D.   On: 01/03/2017 10:37    Review of Systems  Constitutional: Negative.   HENT:       See history of present illness  Eyes: Negative.   Respiratory: Negative.   Cardiovascular: Negative.   Gastrointestinal: Negative.   Genitourinary: Negative.   Musculoskeletal:       See history of present illness  Skin: Negative.     Blood pressure 137/81, pulse 70, temperature 97.7 F (36.5 C), temperature source Oral, resp. rate 16, height 5\' 11"  (1.803 m), weight 70.3 kg (155 lb), SpO2 100 %. Physical Exam  The patient is alert and oriented in no acute distress. The patient complains of pain in the affected upper extremity.  The patient is noted to have a abrasions about the nasal bridge he is minimally tender to palpation nares appears patent exam. Lung fields show equal chest  expansion and no shortness of breath. Abdomen exam is nontender without distention. Lower extremity examination does not show any fracture dislocation or blood clot symptoms. Left upper extremity: Gross sensation is intact, however, he does describe  slight numbness about the thumb and lesser extent the remaining digits as the fingers have had ice applied to them, median  nerve motor functions are intact. Ulnar and radial nerve functions are intact. He has obvious deformity about the wrist with associated swelling and pain present. Elbow is nontender shoulder is nontender. Pelvis is stable and the neck and back are stable and nontender. Assessment/Plan Comminuted displaced left distal radius fracture Tobacco abuse Nicotine abuse We have discussed with the patient given the alignment of his fracture and severity of his comminution we would recommend operative fixation. We are planning surgery for your upper extremity. The risk and benefits of surgery to include risk of bleeding, infection, anesthesia,  damage to normal structures and failure of the surgery to accomplish its intended goals of relieving symptoms and restoring function have been discussed in detail. With this in mind we plan to proceed. I have specifically discussed with the patient the pre-and postoperative regime and the dos and don'ts and risk and benefits in great detail. Risk and benefits of surgery also include risk of dystrophy(CRPS), chronic nerve pain, failure of the healing process to go onto completion and other inherent risks of surgery The relavent the pathophysiology of the disease/injury process, as well as the alternatives for treatment and postoperative course of action has been discussed in great detail with the patient who desires to proceed.  We will do everything in our power to help you (the patient) restore function to the upper extremity. It is a pleasure to see this patient today.   Jair Lindblad L, PA-C 01/03/2017, 1:40 PM

## 2017-01-04 ENCOUNTER — Encounter (HOSPITAL_COMMUNITY): Payer: Self-pay | Admitting: *Deleted

## 2017-01-04 MED ORDER — OXYCODONE HCL 5 MG PO TABS: 5.0000 mg | ORAL_TABLET | ORAL | 0 refills | Status: AC | PRN

## 2017-01-04 NOTE — Evaluation (Signed)
Occupational Therapy Evaluation Patient Details Name: Brian Drake MRN: [id number] DOB: 1985-09-09 Today's Date: 01/04/2017    History of Present Illness pt sustained a fall from ladder and sustained L comminuted  radius fx.  He underwent ORIF and CTR   Clinical Impression   This 31 year old man was admitted for the above.  All education was completed. No further OT is needed in the acute setting. He will follow up with Dr Amanda Pea for further therapy as appropriate     Follow Up Recommendations  Supervision - Intermittent    Equipment Recommendations  None recommended by OT    Recommendations for Other Services       Precautions / Restrictions Precautions Precautions: None Restrictions Weight Bearing Restrictions: Yes Other Position/Activity Restrictions: NWB      Mobility Bed Mobility               General bed mobility comments: oob  Transfers Overall transfer level: Independent                    Balance                                           ADL either performed or assessed with clinical judgement   ADL Overall ADL's : Needs assistance/impaired Eating/Feeding: Set up;Sitting   Grooming: Minimal assistance;Standing   Upper Body Bathing: Minimal assistance;Sitting   Lower Body Bathing: Independent   Upper Body Dressing : Minimal assistance;Sitting   Lower Body Dressing: Minimal assistance;Sit to/from stand   Toilet Transfer: Independent   Toileting- Architect and Hygiene: Modified independent (extra time)         General ADL Comments: pt needs min A due to immobilization of L wrist, NWB.  Educated on NWB and postioning for edema management as well as to continue finger ROM. Pt has a sling for walking; educated on how to don and adjusting to keep arm elevated     Vision         Perception     Praxis      Pertinent Vitals/Pain Pain Assessment: No/denies pain     Hand Dominance Left    Extremity/Trunk Assessment Upper Extremity Assessment Upper Extremity Assessment: LUE deficits/detail (immobilized wrist after sx) LUE Deficits / Details: pt able to move all fingers, elbow and shoulder           Communication Communication Communication: No difficulties   Cognition Arousal/Alertness: Awake/alert Behavior During Therapy: WFL for tasks assessed/performed Overall Cognitive Status: Within Functional Limits for tasks assessed                                     General Comments       Exercises     Shoulder Instructions      Home Living Family/patient expects to be discharged to:: Private residence Living Arrangements: Alone Available Help at Discharge: Family                             Additional Comments: mother and fiance will asist pt      Prior Functioning/Environment Level of Independence: Independent                 OT Problem List:  OT Treatment/Interventions:      OT Goals(Current goals can be found in the care plan section) Acute Rehab OT Goals Patient Stated Goal: return to independence OT Goal Formulation: All assessment and education complete, DC therapy  OT Frequency:     Barriers to D/C:            Co-evaluation              AM-PAC PT "6 Clicks" Daily Activity     Outcome Measure Help from another person eating meals?: A Little Help from another person taking care of personal grooming?: A Little Help from another person toileting, which includes using toliet, bedpan, or urinal?: None Help from another person bathing (including washing, rinsing, drying)?: A Little Help from another person to put on and taking off regular upper body clothing?: A Little Help from another person to put on and taking off regular lower body clothing?: None 6 Click Score: 20   End of Session    Activity Tolerance: Patient tolerated treatment well Patient left: in chair;with call bell/phone within  reach  OT Visit Diagnosis: Muscle weakness                Time: [id number] OT Time Calculation (min): 10 min Charges:  OT General Charges $OT Visit: 1 Procedure OT Evaluation $OT Eval Low Complexity: 1 Procedure G-Codes: OT G-codes **NOT FOR INPATIENT CLASS** Functional Assessment Tool Used: AM-PAC 6 Clicks Daily Activity;Clinical judgement Functional Limitation: Self care Self Care Current Status (W1191(G8987): At least 20 percent but less than 40 percent impaired, limited or restricted Self Care Goal Status ([id number](G8988): At least 20 percent but less than 40 percent impaired, limited or restricted Self Care Discharge Status [phone](G8989): At least 20 percent but less than 40 percent impaired, limited or restricted   Marica OtterMaryellen Quentin Strebel, OTR/L [phone] 01/04/2017  [name] 01/04/2017, 11:09 AM

## 2017-01-04 NOTE — Anesthesia Postprocedure Evaluation (Signed)
Anesthesia Post Note  Patient: Brian Drake  Procedure(s) Performed: Procedure(s) (LRB): OPEN REDUCTION INTERNAL FIXATION (ORIF) DISTAL RADIAL FRACTURE (Left) CARPAL TUNNEL RELEASE (Left)     Patient location during evaluation: PACU Anesthesia Type: General Level of consciousness: awake and alert Pain management: pain level controlled Vital Signs Assessment: post-procedure vital signs reviewed and stable Respiratory status: spontaneous breathing, nonlabored ventilation and respiratory function stable Cardiovascular status: blood pressure returned to baseline and stable Postop Assessment: no signs of nausea or vomiting Anesthetic complications: no    Last Vitals:  Vitals:   01/04/17 0500 01/04/17 0930  BP: (!) 114/58 132/83  Pulse: 73 76  Resp: 16 20  Temp: 36.9 C 37 C    Last Pain:  Vitals:   01/04/17 0930  TempSrc: Oral  PainSc:                  Cecile HearingStephen Edward Jalena Vanderlinden

## 2017-01-04 NOTE — Op Note (Signed)
NAMTora Drake:  Brian Drake, Brian Drake              ACCOUNT NO.:  [id number]  MEDICAL RECORD NO.:  [id number]  LOCATION:  WTR5                         [facility]  PHYSICIAN:  Brian Drake, DrakeDATE OF BIRTH:  1985-11-16  DATE OF PROCEDURE:01/03/17 DATE OF DISCHARGE:tbd                              OPERATIVE REPORT   PREOPERATIVE DIAGNOSIS:  Comminuted complex left wrist fracture, greater than 5-part intra-articular with associated evolving carpal tunnel syndrome.  POSTOPERATIVE DIAGNOSIS:  Comminuted complex left wrist fracture, greater than 5-part intra-articular with associated evolving carpal tunnel syndrome.  PROCEDURES: 1. Open reduction and internal fixation with extended volar rim DVR     plate, left upper extremity/radius secondary to comminuted complex     greater than 5-part intra-articular fracture. 2. AP, lateral, and oblique x-rays performed, examined, and     interpreted by myself. 3. Open left carpal tunnel release.  SURGEON:  Brian AnoWilliam M. Brian Drake, M.D.  ASSISTANT:  Brian Drake, P.A.-C.  COMPLICATIONS:  None.  ANESTHESIA:  General with preoperative block.  TOURNIQUET TIME:  Less than an hour.  DRAINS:  One.  INDICATIONS:  A pleasant male who presents with the above-mentioned diagnosis.  I have counseled him in regard to risks and benefits of surgery, and he desires to proceed with the above-mentioned operative intervention.  DESCRIPTION OF PROCEDURE:  The patient was seen by myself and anesthesia block was placed in the preop holding area.  He was carefully counseled and we then taken him to the operative theater and he underwent a very careful and cautious general anesthetic placement.  Following this, he was prepped and draped with Hibiclens scrub x2 followed by a 10-minute surgical Betadine scrub which I performed myself, so as not to allow fragments to be comminuted.  Once this was complete, we then very carefully and cautiously performed a time-out, and  sterile field was secured.  Arm was elevated, tourniquet was insufflated.  Volar radial incision was made.  Dissection was carried out and the FCR tendon was incised dorsally and palmarly followed by a very careful and cautious approach to the pronator being incised in an L-shaped fashion, swept radial to ulnar.  Fracture was accessed.  Freer elevator was placed in the fracture site.  I was able to get the joint line back to normal and then placed an extended volar rim plate in standard AO technique.  I used some variable screws in the radial styloid to make sure I got excellent purchase and I checked screw lengths meticulously to make sure we had good alignment in terms of the screws, both threaded and smooth pegs.  Following this, adequate height, inclination, and volar tilt were noted to be restored, and there were no complicating features.  Thus, ORIF greater than 5-part intra-articular distal radius fracture was accomplished with 4-view radiographic series performed, examined, and interpreted by myself, which looked to be excellent.  I then performed irrigation followed by closure of the pronator.  Incision was then made at the carpal tunnel.  Dissection was carried down.  The transverse carpal tunnel was released in its entirety without difficulty.  There were no complicating features.  Once it was released, we then very carefully irrigated and ultimately closed the  wound with Prolene.  The vein incision was closed over a #7 TLS drain.  He will be admitted for IV antibiotics and general postop observation.  He will be discharged home in the morning and we will see him back in the office in 12 to 14 days for standard postop algorithm.  Elevate, move, and massage fingers and other measures will be adhered to.  It is a pleasure to see him today and participate in his care plan. Should any problems arise, he will notify us.  Standard DVR protocol postop.  I should note his distal  radioulnar joint mechanics were excellent and smooth at the conclusion of the procedure.     Brian Drake. Brian Drake, M.D.     [facility]  D:  01/03/2017  T:  01/04/2017  Job:  [id number]

## 2017-01-04 NOTE — Discharge Instructions (Signed)

## 2017-01-04 NOTE — Discharge Summary (Signed)
Physician Discharge Summary  Patient ID: Brian Drake MRN: [id number] DOB/AGE: [birth year] 31 y.o.  Admit date: 01/03/2017 Discharge date:8 /12/2016  Admission Diagnoses: fracture left distal radius History reviewed. No pertinent past medical history.  Discharge Diagnoses:  Active Problems:   Closed fracture of left distal radius Status post open reduction internal fixation left distal radius fracture and associated carpal tunnel release  Surgeries: Procedure(s): OPEN REDUCTION INTERNAL FIXATION (ORIF) DISTAL RADIAL FRACTURE CARPAL TUNNEL RELEASE on 01/03/2017    Consultants: none  Discharged Condition: Improved  Hospital Course: Brian Drake is an 31 y.o. male who was admitted 01/03/2017 with a chief complaint of Chief Complaint  Patient presents with  . Wrist Injury  , and found to have a diagnosis of fracture left distal radius.  They were brought to the operating room on 01/03/2017 and underwent Procedure(s): OPEN REDUCTION INTERNAL FIXATION (ORIF) DISTAL RADIAL FRACTURE CARPAL TUNNEL RELEASE.  The patient tolerated the procedure well and there were no complications. He was admitted for standard IV antibiotics, pain management, elevation and edema control. Postoperative day #1 the patient was doing very well he is having minimal pain at that juncture. He still had some mild residual effects of the block present. He denied nausea, vomiting, fever or chills. Physical examination he was very pleasant in no acute distress. HEENT showed that he had abrasions about the nasal bridge. Chest with equal expansions present respirations were nonlabored. Evaluation of the left upper extremity shows that his splint is clean dry and intact. The drain is removed without difficulties. Digital range of motion is intact he has some residual effects of the block along the ulnar nerve distribution gross sensation is intact. No signs of infection are present. We have discussed with the patient and his mother  all discharge instructions and we'll see him back in 2 weeks for splint removal as well as suture removal. Radiographs will be taken at first visit and he'll be placed into a cast. We have discussed with him all issues. We'll call for any questions or concerns.  They were given perioperative antibiotics: Anti-infectives    Start     Dose/Rate Route Frequency Ordered Stop   01/04/17 0630  ceFAZolin (ANCEF) IVPB 1 g/50 mL premix     1 g 100 mL/hr over 30 Minutes Intravenous Every 8 hours 01/03/17 2317     01/03/17 2245  ceFAZolin (ANCEF) IVPB 1 g/50 mL premix     1 g 100 mL/hr over 30 Minutes Intravenous NOW 01/03/17 2235 01/03/17 2310   01/03/17 1900  ceFAZolin (ANCEF) IVPB 2 g/50 mL premix     2 g 100 mL/hr over 30 Minutes Intravenous  Once 01/03/17 1856 01/03/17 2049   01/03/17 1858  ceFAZolin (ANCEF) 2-4 GM/100ML-% IVPB    Comments:  Ave Filterhandler, Loraine   : cabinet override      01/03/17 1858 01/04/17 0714    .  They were given sequential compression devices, early ambulation for DVT prophylaxis.  Recent vital signs: Patient Vitals for the past 24 hrs:  BP Temp Temp src Pulse Resp SpO2 Height Weight  01/04/17 0500 (!) 114/58 98.5 F (36.9 C) Oral 73 16 100 % - -  01/04/17 0154 (!) 120/54 98.5 F (36.9 C) Oral 71 16 98 % - -  01/03/17 2313 129/75 98.3 F (36.8 C) - (!) 59 17 97 % - -  01/03/17 2245 123/65 98.1 F (36.7 C) - 66 11 98 % - -  01/03/17 2230 130/79 - - 83 16 100 % - -  01/03/17 2223 134/73 98.7 F (37.1 C) - 80 16 100 % - -  01/03/17 2008 126/71 - - 71 15 100 % - -  01/03/17 1948 123/68 - - 72 14 100 % - -  01/03/17 1938 128/75 - - 74 14 100 % - -  01/03/17 1925 132/78 - - 66 12 100 % - -  01/03/17 1919 134/87 - - 64 12 100 % - -  01/03/17 1728 130/65 - - 63 16 100 % - -  01/03/17 1500 129/79 - - 63 - 100 % - -  01/03/17 1447 124/83 - - 61 15 100 % - -  01/03/17 1302 137/81 - - 70 16 100 % - -  01/03/17 1007 (!) 145/89 97.7 F (36.5 C) Oral 68 16 100 % 5\' 11"   (1.803 m) 70.3 kg (155 lb)  .  Recent laboratory studies: Dg Chest 2 View  Result Date: 01/03/2017 CLINICAL DATA:  Preop.  Wrist fracture.  Current everyday smoker. EXAM: CHEST  2 VIEW COMPARISON:  None. FINDINGS: Generous lung volumes. There is no edema, consolidation, effusion, or pneumothorax. Normal heart size and mediastinal contours. IMPRESSION: 1. No evidence of active disease. 2. Large lung volumes. Electronically Signed   By: Marnee Spring M.D.   On: 01/03/2017 14:30   Dg Wrist Complete Left  Result Date: 01/03/2017 CLINICAL DATA:  Fall from a ladder this morning.  Initial encounter. EXAM: LEFT WRIST - COMPLETE 3+ VIEW COMPARISON:  None. FINDINGS: Comminuted fracture of the distal radius with intra-articular involvement. Dorsal impaction with dorsal wrist tilting. Probable loss of radial inclination. Neutral ulnar variance. Located radiocarpal joint. No carpus fracture noted. IMPRESSION: Comminuted intra-articular distal radius fracture with dorsal impaction and wrist tilting. Electronically Signed   By: Marnee Spring M.D.   On: 01/03/2017 10:36   Dg Hand Complete Left  Result Date: 01/03/2017 CLINICAL DATA:  Fall from ladder with left wrist pain. Initial encounter. EXAM: LEFT HAND - COMPLETE 3+ VIEW COMPARISON:  None. FINDINGS: Comminuted fracture of the distal radius with and dorsal impaction causing dorsal wrist tilting. Up to 2 mm of articular surface offset at the fracture. No visible carpus fracture or malalignment. IMPRESSION: Comminuted intra-articular distal radius fracture with dorsal impaction. Electronically Signed   By: Marnee Spring M.D.   On: 01/03/2017 10:37    Discharge Medications:   Allergies as of 01/04/2017   No Known Allergies     Medication List    TAKE these medications   naproxen sodium 220 MG tablet Commonly known as:  ANAPROX Take 220 mg by mouth 2 (two) times daily as needed (pain).   oxyCODONE 5 MG immediate release tablet Commonly known as:  Oxy  IR/ROXICODONE Take 1 tablet (5 mg total) by mouth every 4 (four) hours as needed for moderate pain (take 1 to 2 every 4 to 6 hours as needed for pain).   traMADol 50 MG tablet Commonly known as:  ULTRAM Take 1 tablet (50 mg total) by mouth every 6 (six) hours as needed for pain.       Diagnostic Studies: Dg Chest 2 View  Result Date: 01/03/2017 CLINICAL DATA:  Preop.  Wrist fracture.  Current everyday smoker. EXAM: CHEST  2 VIEW COMPARISON:  None. FINDINGS: Generous lung volumes. There is no edema, consolidation, effusion, or pneumothorax. Normal heart size and mediastinal contours. IMPRESSION: 1. No evidence of active disease. 2. Large lung volumes. Electronically Signed   By: Marnee Spring M.D.   On: 01/03/2017 14:30  Dg Wrist Complete Left  Result Date: 01/03/2017 CLINICAL DATA:  Fall from a ladder this morning.  Initial encounter. EXAM: LEFT WRIST - COMPLETE 3+ VIEW COMPARISON:  None. FINDINGS: Comminuted fracture of the distal radius with intra-articular involvement. Dorsal impaction with dorsal wrist tilting. Probable loss of radial inclination. Neutral ulnar variance. Located radiocarpal joint. No carpus fracture noted. IMPRESSION: Comminuted intra-articular distal radius fracture with dorsal impaction and wrist tilting. Electronically Signed   By: Marnee Spring M.D.   On: 01/03/2017 10:36   Dg Hand Complete Left  Result Date: 01/03/2017 CLINICAL DATA:  Fall from ladder with left wrist pain. Initial encounter. EXAM: LEFT HAND - COMPLETE 3+ VIEW COMPARISON:  None. FINDINGS: Comminuted fracture of the distal radius with and dorsal impaction causing dorsal wrist tilting. Up to 2 mm of articular surface offset at the fracture. No visible carpus fracture or malalignment. IMPRESSION: Comminuted intra-articular distal radius fracture with dorsal impaction. Electronically Signed   By: Marnee Spring M.D.   On: 01/03/2017 10:37    They benefited maximally from their hospital stay and there  were no complications.     Disposition: 01-Home or Self Care Discharge Instructions    Call MD / Call 911    Complete by:  As directed    If you experience chest pain or shortness of breath, CALL 911 and be transported to the hospital emergency room.  If you develope a fever above 101 F, pus (white drainage) or increased drainage or redness at the wound, or calf pain, call your surgeon's office.   Constipation Prevention    Complete by:  As directed    Drink plenty of fluids.  Prune juice may be helpful.  You may use a stool softener, such as Colace (over the counter) 100 mg twice a day.  Use MiraLax (over the counter) for constipation as needed.   Diet - low sodium heart healthy    Complete by:  As directed    Discharge instructions    Complete by:  As directed    Keep bandage clean and dry.  Call for any problems.  No smoking.  Criteria for driving a car: you should be off your pain medicine for 7-8 hours, able to drive one handed(confident), thinking clearly and feeling able in your judgement to drive. Continue elevation as it will decrease swelling.  If instructed by MD move your fingers within the confines of the bandage/splint.  Use ice if instructed by your MD. Call immediately for any sudden loss of feeling in your hand/arm or change in functional abilities of the extremity.We recommend that you to take vitamin C 1000 mg a day to promote healing. We also recommend that if you require  pain medicine that you take a stool softener to prevent constipation as most pain medicines will have constipation side effects. We recommend either Peri-Colace or Senokot and recommend that you also consider adding MiraLAX as well to prevent the constipation affects from pain medicine if you are required to use them. These medicines are over the counter and may be purchased at a local pharmacy. A cup of yogurt and a probiotic can also be helpful during the recovery process as the medicines can disrupt your  intestinal environment.   Increase activity slowly as tolerated    Complete by:  As directed      Follow-up Information    Dominica Severin, MD. Schedule an appointment as soon as possible for a visit in 2 week(s).   Specialty:  Orthopedic  Surgery Why:  Call [phone] Contact information: [address] [place] [address] [phone]            Signed: Sheran Lawless 01/04/2017, 8:46 AM

## 2017-01-05 ENCOUNTER — Encounter (HOSPITAL_COMMUNITY): Payer: Self-pay | Admitting: Orthopedic Surgery

## 2018-10-15 IMAGING — CR DG WRIST COMPLETE 3+V*L*
4 series · 4 of 4 positions shown · non-contrast
Comparison: None.

CLINICAL DATA: Fall from a ladder this morning.  Initial encounter.

EXAM:
LEFT WRIST - COMPLETE 3+ VIEW

[x wrist pa left]
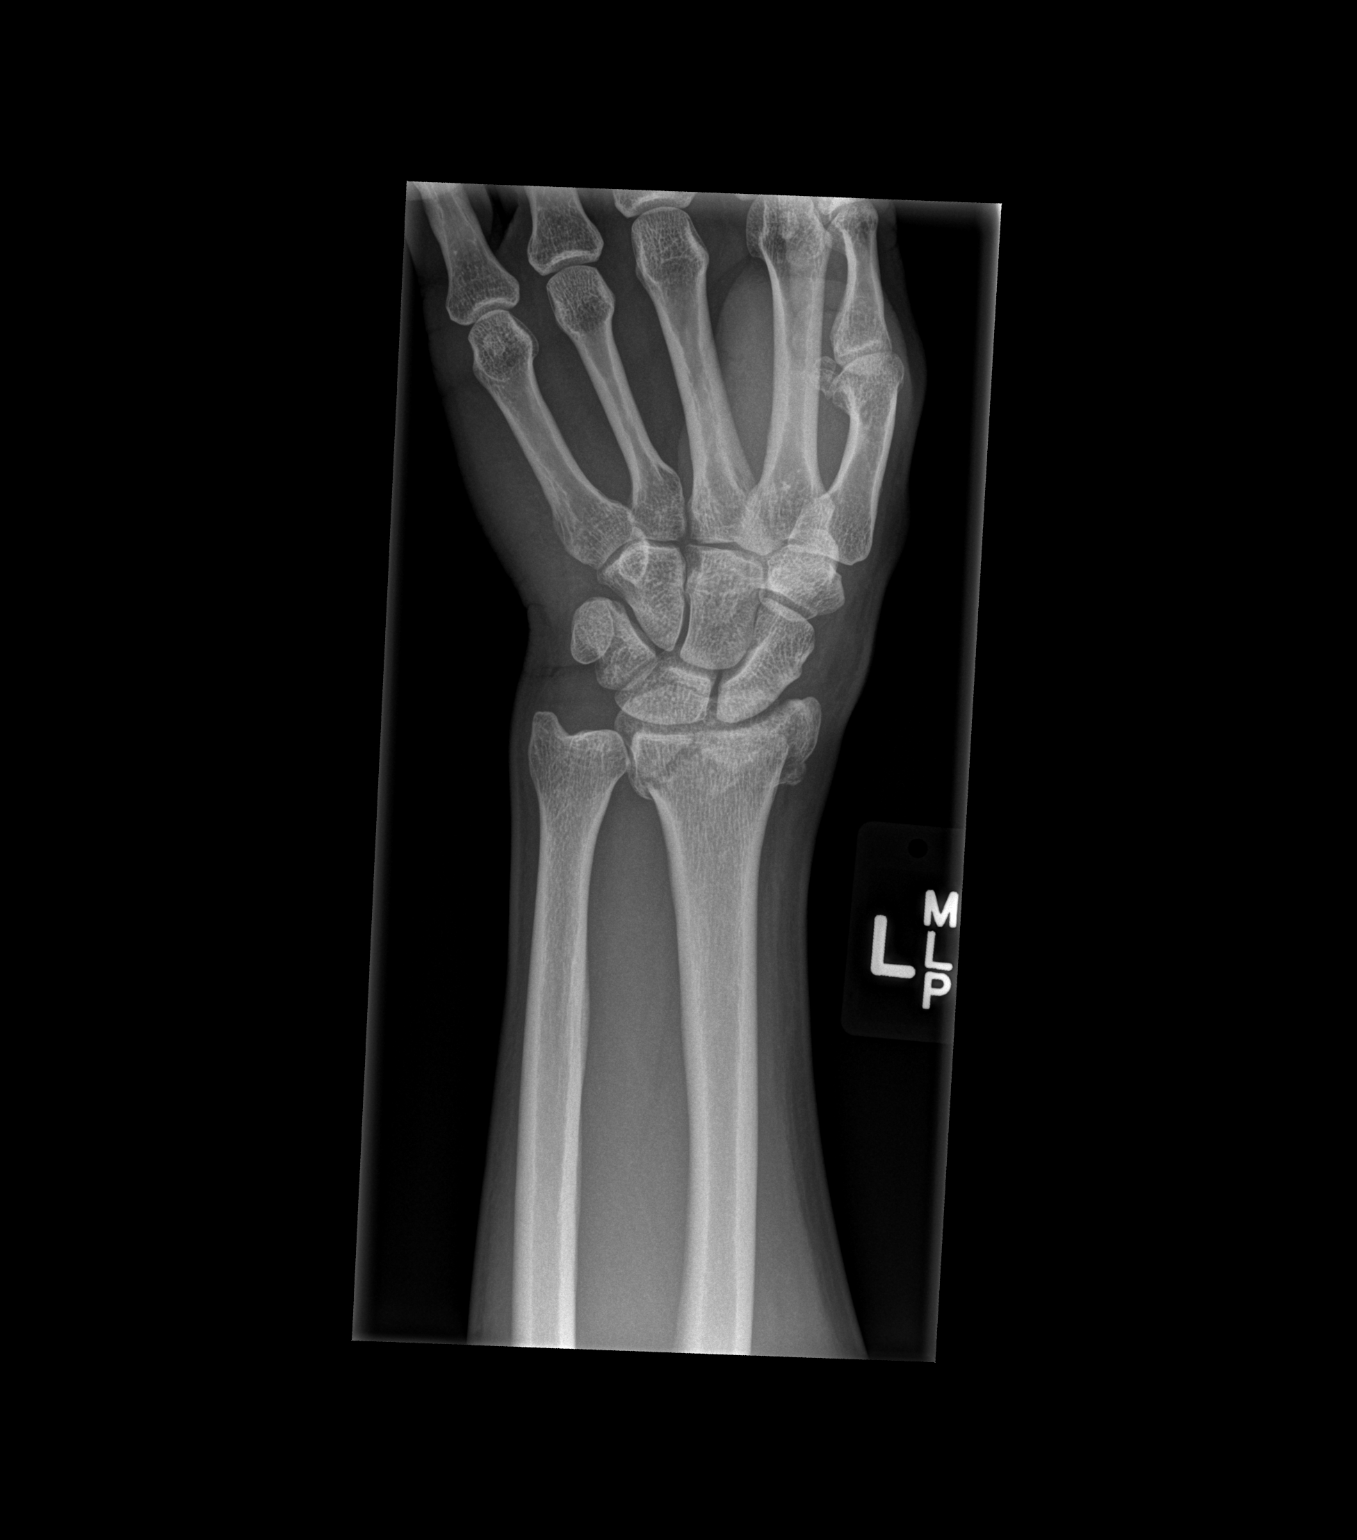

[x wrist obl left]
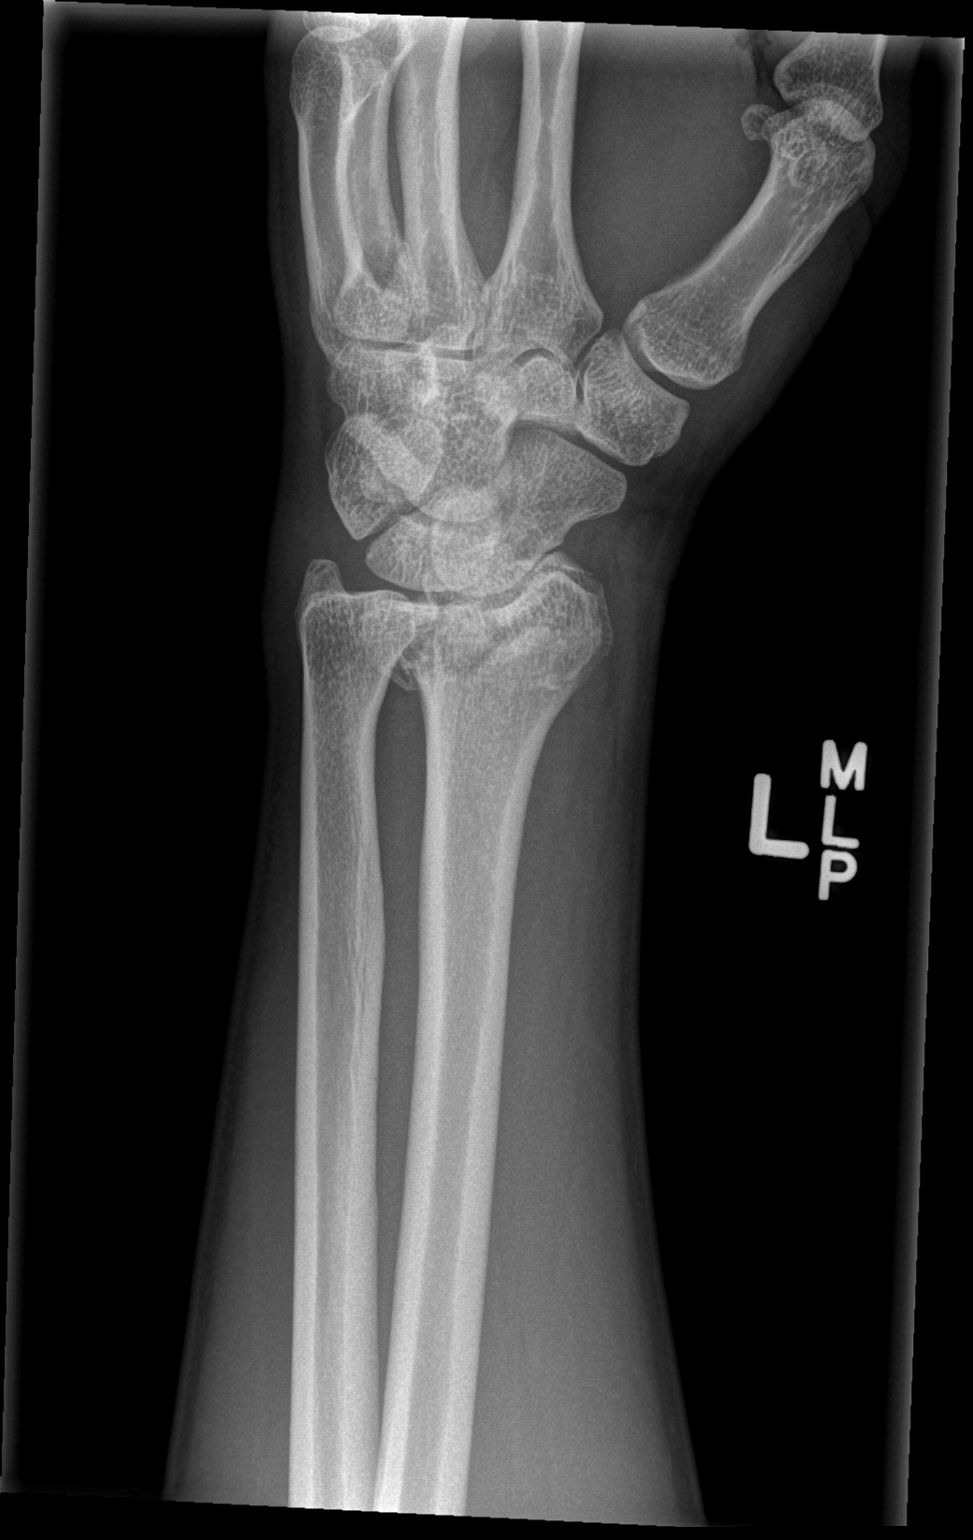

[x wrist lat left]
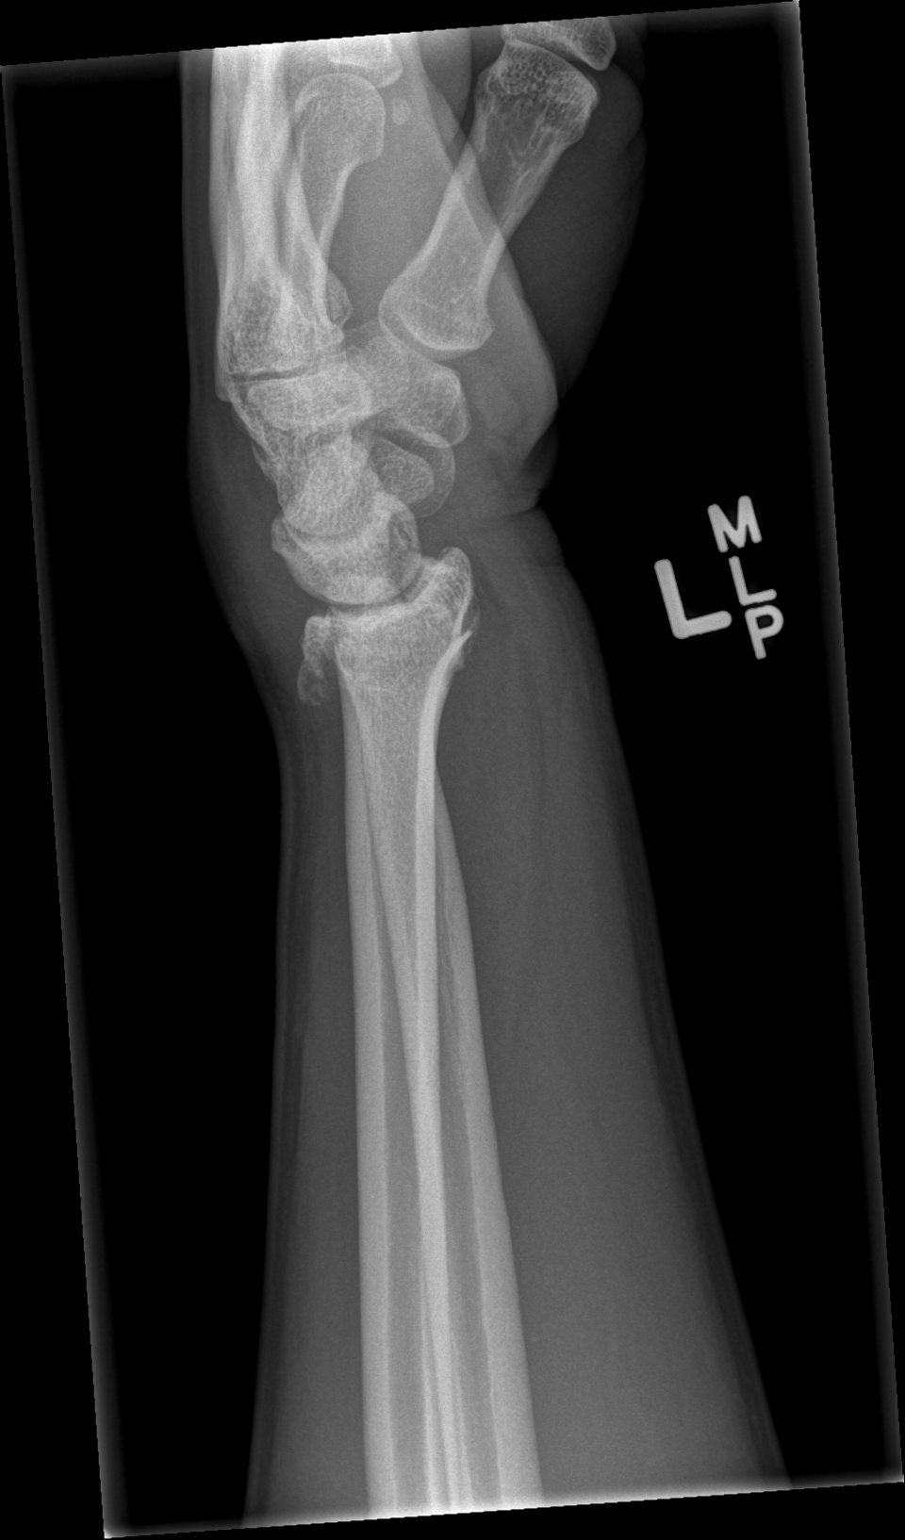

[x wrist navicular view left]
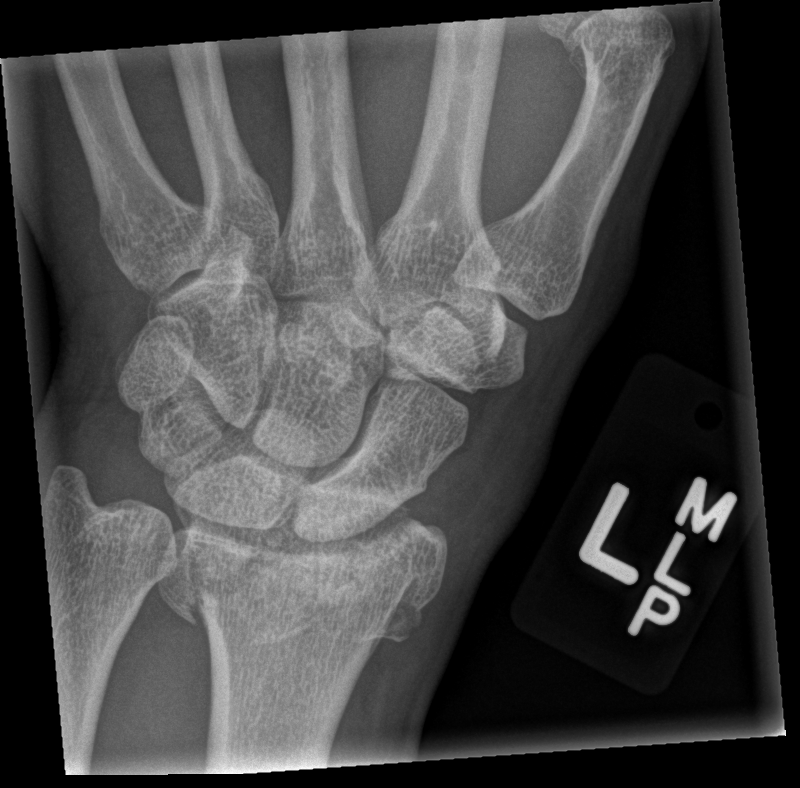

[4 of 4 positions shown; findings below may reference images not displayed]

FINDINGS: Comminuted fracture of the distal radius with intra-articular
involvement. Dorsal impaction with dorsal wrist tilting. Probable
loss of radial inclination. Neutral ulnar variance. Located
radiocarpal joint. No carpus fracture noted.
IMPRESSION: Comminuted intra-articular distal radius fracture with dorsal
impaction and wrist tilting.
